# Patient Record
Sex: Female | Born: 1965 | Race: Black or African American | Hispanic: No | Marital: Married | Smoking: Never smoker
Health system: Southern US, Community
[De-identification: ages and names within clinical notes are randomized; demographics above are authoritative.]

## PROBLEM LIST (undated history)

## (undated) DIAGNOSIS — E119 Type 2 diabetes mellitus without complications: Secondary | ICD-10-CM

## (undated) DIAGNOSIS — I10 Essential (primary) hypertension: Secondary | ICD-10-CM

## (undated) HISTORY — DX: Type 2 diabetes mellitus without complications: E11.9

## (undated) HISTORY — PX: TUBAL LIGATION: SHX77

## (undated) HISTORY — DX: Essential (primary) hypertension: I10

---

## 2002-11-27 ENCOUNTER — Other Ambulatory Visit: Admission: RE | Admit: 2002-11-27 | Discharge: 2002-11-27 | Payer: Self-pay | Admitting: Obstetrics & Gynecology

## 2003-01-08 ENCOUNTER — Ambulatory Visit (HOSPITAL_COMMUNITY): Admission: RE | Admit: 2003-01-08 | Discharge: 2003-01-08 | Payer: Self-pay | Admitting: Obstetrics & Gynecology

## 2003-01-08 ENCOUNTER — Encounter: Payer: Self-pay | Admitting: Obstetrics & Gynecology

## 2008-06-19 ENCOUNTER — Emergency Department (HOSPITAL_COMMUNITY): Admission: EM | Admit: 2008-06-19 | Discharge: 2008-06-19 | Payer: Self-pay | Admitting: Family Medicine

## 2009-11-10 ENCOUNTER — Emergency Department (HOSPITAL_COMMUNITY): Admission: EM | Admit: 2009-11-10 | Discharge: 2009-11-11 | Payer: Self-pay | Admitting: Emergency Medicine

## 2015-04-12 ENCOUNTER — Encounter: Payer: Self-pay | Admitting: Obstetrics & Gynecology

## 2015-11-22 ENCOUNTER — Other Ambulatory Visit: Payer: Self-pay | Admitting: Family Medicine

## 2015-11-22 DIAGNOSIS — R1310 Dysphagia, unspecified: Secondary | ICD-10-CM

## 2015-11-23 ENCOUNTER — Other Ambulatory Visit: Payer: Self-pay

## 2017-08-28 ENCOUNTER — Ambulatory Visit: Payer: Self-pay | Admitting: Obstetrics & Gynecology

## 2017-08-28 ENCOUNTER — Ambulatory Visit (INDEPENDENT_AMBULATORY_CARE_PROVIDER_SITE_OTHER): Payer: 59 | Admitting: Obstetrics & Gynecology

## 2017-08-28 ENCOUNTER — Encounter: Payer: Self-pay | Admitting: Obstetrics & Gynecology

## 2017-08-28 VITALS — BP 120/80 | Ht 63.0 in | Wt 200.0 lb

## 2017-08-28 DIAGNOSIS — N921 Excessive and frequent menstruation with irregular cycle: Secondary | ICD-10-CM | POA: Diagnosis not present

## 2017-08-28 DIAGNOSIS — Z975 Presence of (intrauterine) contraceptive device: Secondary | ICD-10-CM | POA: Diagnosis not present

## 2017-08-28 DIAGNOSIS — Z23 Encounter for immunization: Secondary | ICD-10-CM

## 2017-08-28 NOTE — Progress Notes (Signed)
    Pamela Shelton 02/12/1966 [id number]        51 y.o.  [id number]  Married.  S/P Tubal Ligation.  RP:  BTB on Mirena IUD  HPI: On Mirena IUD for heavy periods, inserted 02/2013.  Was well on it until this month, when she had vaginal bleeding most days since 10/7th.  Had pelvic cramps towards the Right when bleeding.  Currently only having light spotting.  No abnormal d/c.  Mictions/BMs wnl.  Past medical history,surgical history, problem list, medications, allergies, family history and social history were all reviewed and documented in the EPIC chart.  Directed ROS with pertinent positives and negatives documented in the history of present illness/assessment and plan.  Exam:  Vitals:   08/28/17 1418  BP: 120/80  Weight: 200 lb (90.7 kg)  Height: 5\' 3"  (1.6 m)   General appearance:  Normal  Gyn exam:  Vulva normal.  Speculum:  Cervix normal with very long strings visible.  No IUD part seen otherwise.  Vagina normal.  Bimanual exam:  AV uterus, normal volume, mobile, NT.  No adnexal mass.   Assessment/Plan:  51 y.o. [id number]   1. Breakthrough bleeding associated with intrauterine device (IUD) F/U Pelvic US to verify Mirena IUD placement (very long strings with BTB) and r/o IU lesion. - Pelvic US  2. Flu vaccine need  - Flu Vaccine QUAD 36+ mos IM (Fluarix, Quad PF)  Counseling on above issues >50% x 15 minutes  Genia DelMarie-Lyne Hafiz Irion MD, 2:32 PM 08/28/2017

## 2017-08-28 NOTE — Patient Instructions (Signed)
1. Breakthrough bleeding associated with intrauterine device (IUD) F/U Pelvic US to verify Mirena IUD placement (very long strings with BTB) and r/o IU lesion. - Pelvic US  2. Flu vaccine need  - Flu Vaccine QUAD 36+ mos IM (Fluarix, Quad PF)  Brayton CavesJessie, it was a pleasure seeing you today!  See you again soon for your Pelvic US.

## 2018-11-24 ENCOUNTER — Encounter: Payer: Self-pay | Admitting: Obstetrics & Gynecology

## 2018-11-24 ENCOUNTER — Ambulatory Visit (INDEPENDENT_AMBULATORY_CARE_PROVIDER_SITE_OTHER): Payer: 59 | Admitting: Obstetrics & Gynecology

## 2018-11-24 VITALS — BP 126/84

## 2018-11-24 DIAGNOSIS — N898 Other specified noninflammatory disorders of vagina: Secondary | ICD-10-CM | POA: Diagnosis not present

## 2018-11-24 DIAGNOSIS — Z30432 Encounter for removal of intrauterine contraceptive device: Secondary | ICD-10-CM | POA: Diagnosis not present

## 2018-11-24 DIAGNOSIS — N911 Secondary amenorrhea: Secondary | ICD-10-CM

## 2018-11-24 LAB — WET PREP FOR TRICH, YEAST, CLUE

## 2018-11-24 MED ORDER — FLUCONAZOLE 150 MG PO TABS: 150.0000 mg | ORAL_TABLET | Freq: Every day | ORAL | 2 refills | Status: AC

## 2018-11-24 NOTE — Progress Notes (Signed)
    Pamela Shelton 01-15-1966 [id number]        53 y.o.  [id number]  Married  RP: Vulvar itching and irritation  HPI: Mirena IUD x 02/2013.  No BTB, no menses.    Vulvar irritation/itching x 1 month.  Vaginal discharge wnl per patient.  Used Vagisil without improvement.  No pelvic pain.  No pain with IC.   OB History  Gravida Para Term Preterm AB Living  6 4     1 4   SAB TAB Ectopic Multiple Live Births  1            # Outcome Date GA Lbr Len/2nd Weight Sex Delivery Anes PTL Lv  6 Gravida           5 SAB           4 Para           3 Para           2 Para           1 Para             Past medical history,surgical history, problem list, medications, allergies, family history and social history were all reviewed and documented in the EPIC chart.   Directed ROS with pertinent positives and negatives documented in the history of present illness/assessment and plan.  Exam:  Vitals:   11/24/18 1403  BP: 126/84   General appearance:  Normal  Abdomen: Normal  Gynecologic exam: Vulva with erythema at the labia minora and introitus.  Speculum: Cervix and vagina normal.  Mild increase in vaginal discharge.  Wet prep done.  Bimanual exam: Uterus anteverted, normal volume, mobile, nontender.  No adnexal mass, nontender bilaterally.  Wet prep: Yeasts present   Assessment/Plan:  53 y.o. [id number]   1. Vaginal pruritus Yeast vaginitis clinically and per wet prep.  Will treat with fluconazole 150 mg/tab 1 tablet daily for 3 days.  Usage of medication reviewed with patient and prescription sent to pharmacy. - WET PREP FOR TRICH, YEAST, CLUE  2. Amenorrhea, secondary Probably menopausal, will check [facility]SH today.  Mirena IUD was overdue by 10 months.  Removed today.  Recommend using condoms for 1 year after diagnosis of menopause. - [facility]  3. Encounter for IUD removal Mirena IUD was over due to remove by 10 months.  Easily removed today.  Use condoms for contraception.  May decide to  follow-up for a Mirena IUD insertion to have good contraception for 1 to 2 years into menopause.  Will follow-up for annual gynecologic exam.  Other orders - fluconazole (DIFLUCAN) 150 MG tablet; Take 1 tablet (150 mg total) by mouth daily for 3 days.  Counseling on above issues and coordination of care more than 50% for 25 minutes.  Genia Del MD, 2:10 PM 11/24/2018

## 2018-11-24 NOTE — Patient Instructions (Signed)
1. Vaginal pruritus Yeast vaginitis clinically and per wet prep.  Will treat with fluconazole 150 mg/tab 1 tablet daily for 3 days.  Usage of medication reviewed with patient and prescription sent to pharmacy. - WET PREP FOR TRICH, YEAST, CLUE  2. Amenorrhea, secondary Probably menopausal, will check FSH today.  Mirena IUD was overdue by 10 months.  Removed today.  Recommend using condoms for 1 year after diagnosis of menopause. - [facility]  3. Encounter for IUD removal Mirena IUD was over due to remove by 10 months.  Easily removed today.  Use condoms for contraception.  May decide to follow-up for a Mirena IUD insertion to have good contraception for 1 to 2 years into menopause.  Will follow-up for annual gynecologic exam.  Other orders - fluconazole (DIFLUCAN) 150 MG tablet; Take 1 tablet (150 mg total) by mouth daily for 3 days.  [place], it was a pleasure seeing you today!  I will inform you of your results as soon as they are available.

## 2018-11-25 ENCOUNTER — Ambulatory Visit: Payer: 59 | Admitting: Obstetrics & Gynecology

## 2018-11-25 LAB — FOLLICLE STIMULATING HORMONE: FSH: 53 m[IU]/mL

## 2018-12-31 ENCOUNTER — Ambulatory Visit
Admission: RE | Admit: 2018-12-31 | Discharge: 2018-12-31 | Disposition: A | Discharge status: Home / Self Care | Payer: 59 | Source: Ambulatory Visit | Attending: Family Medicine | Admitting: Family Medicine

## 2018-12-31 ENCOUNTER — Other Ambulatory Visit: Payer: Self-pay | Admitting: Family Medicine

## 2018-12-31 DIAGNOSIS — M7989 Other specified soft tissue disorders: Secondary | ICD-10-CM

## 2018-12-31 DIAGNOSIS — M545 Low back pain, unspecified: Secondary | ICD-10-CM

## 2019-01-29 ENCOUNTER — Other Ambulatory Visit: Payer: Self-pay

## 2019-02-02 ENCOUNTER — Ambulatory Visit (INDEPENDENT_AMBULATORY_CARE_PROVIDER_SITE_OTHER): Payer: 59 | Admitting: Obstetrics & Gynecology

## 2019-02-02 ENCOUNTER — Other Ambulatory Visit: Payer: Self-pay

## 2019-02-02 ENCOUNTER — Encounter: Payer: Self-pay | Admitting: Obstetrics & Gynecology

## 2019-02-02 VITALS — BP 122/80 | Ht 63.0 in | Wt 204.0 lb

## 2019-02-02 DIAGNOSIS — Z9851 Tubal ligation status: Secondary | ICD-10-CM | POA: Diagnosis not present

## 2019-02-02 DIAGNOSIS — E6609 Other obesity due to excess calories: Secondary | ICD-10-CM

## 2019-02-02 DIAGNOSIS — Z01419 Encounter for gynecological examination (general) (routine) without abnormal findings: Secondary | ICD-10-CM | POA: Diagnosis not present

## 2019-02-02 DIAGNOSIS — Z780 Asymptomatic menopausal state: Secondary | ICD-10-CM | POA: Diagnosis not present

## 2019-02-02 DIAGNOSIS — Z6836 Body mass index (BMI) 36.0-36.9, adult: Secondary | ICD-10-CM

## 2019-02-02 NOTE — Progress Notes (Signed)
Pamela Shelton [birth year] [id number]   History:    53 y.o. [id number] Married.  S/P TL.  RP:  Established patient presenting for annual gyn exam   HPI: Menopause, well on no HRT.  No PMB.  No pelvic pain.  No pain with IC.  S/P TL.  Breasts normal.  BMI 36.14.  Needs to exercise more.  Health labs with Fam MD.  Past medical history,surgical history, family history and social history were all reviewed and documented in the EPIC chart.  Gynecologic History Patient's last menstrual period was 08/11/2017. Contraception: Tubal Ligation Last Pap: 2016, normal Last mammogram: Scheduled 02/2019 Bone Density: Never Colonoscopy: 2019  Obstetric History OB History  Gravida Para Term Preterm AB Living  6 4     1 4   SAB TAB Ectopic Multiple Live Births  1            # Outcome Date GA Lbr Len/2nd Weight Sex Delivery Anes PTL Lv  6 Gravida           5 SAB           4 Para           3 Para           2 Para           1 Para              ROS: A ROS was performed and pertinent positives and negatives are included in the history.  GENERAL: No fevers or chills. HEENT: No change in vision, no earache, sore throat or sinus congestion. NECK: No pain or stiffness. CARDIOVASCULAR: No chest pain or pressure. No palpitations. PULMONARY: No shortness of breath, cough or wheeze. GASTROINTESTINAL: No abdominal pain, nausea, vomiting or diarrhea, melena or bright red blood per rectum. GENITOURINARY: No urinary frequency, urgency, hesitancy or dysuria. MUSCULOSKELETAL: No joint or muscle pain, no back pain, no recent trauma. DERMATOLOGIC: No rash, no itching, no lesions. ENDOCRINE: No polyuria, polydipsia, no heat or cold intolerance. No recent change in weight. HEMATOLOGICAL: No anemia or easy bruising or bleeding. NEUROLOGIC: No headache, seizures, numbness, tingling or weakness. PSYCHIATRIC: No depression, no loss of interest in normal activity or change in sleep pattern.     Exam:   BP 122/80   Ht 5'  3" (1.6 m)   Wt 204 lb (92.5 kg)   LMP 08/11/2017 Comment: MIRENA  BMI 36.14 kg/m   Body mass index is 36.14 kg/m.  General appearance : Well developed well nourished female. No acute distress HEENT: Eyes: no retinal hemorrhage or exudates,  Neck supple, trachea midline, no carotid bruits, no thyroidmegaly Lungs: Clear to auscultation, no rhonchi or wheezes, or rib retractions  Heart: Regular rate and rhythm, no murmurs or gallops Breast:Examined in sitting and supine position were symmetrical in appearance, no palpable masses or tenderness,  no skin retraction, no nipple inversion, no nipple discharge, no skin discoloration, no axillary or supraclavicular lymphadenopathy Abdomen: no palpable masses or tenderness, no rebound or guarding Extremities: no edema or skin discoloration or tenderness  Pelvic: Vulva: Normal             Vagina: No gross lesions or discharge  Cervix: No gross lesions or discharge.  Pap reflex done.  Uterus  AV, normal size, shape and consistency, non-tender and mobile  Adnexa  Without masses or tenderness  Anus: Normal   Assessment/Plan:  53 y.o. female for annual exam   1. Encounter for routine gynecological examination  with Papanicolaou smear of cervix Normal gynecologic exam.  Pap reflex done.  Breast exam normal.  Screening mammogram scheduled April 2020.  Colonoscopy 2019.  Health labs with family physician.  2. S/P tubal ligation  3. Menopause present Well on no hormone replacement therapy.  No postmenopausal bleeding.  Recommend vitamin D supplements, calcium intake of 1.5 g/day total and regular weightbearing physical activities.  4. Class 2 obesity due to excess calories without serious comorbidity with body mass index (BMI) of 36.0 to 36.9 in adult Recommend a lower calorie/carb diet such as [employer].  Intermittent fasting recommended.  Increase aerobic physical activities to 5 times a week and weight lifting every 2 days.  Genia Del MD, 12:41 PM 02/02/2019

## 2019-02-05 ENCOUNTER — Encounter: Payer: Self-pay | Admitting: Obstetrics & Gynecology

## 2019-02-05 LAB — PAP IG W/ RFLX HPV ASCU

## 2019-02-05 LAB — HUMAN PAPILLOMAVIRUS, HIGH RISK: HPV DNA High Risk: NOT DETECTED

## 2019-02-05 NOTE — Patient Instructions (Signed)
1. Encounter for routine gynecological examination with Papanicolaou smear of cervix Normal gynecologic exam.  Pap reflex done.  Breast exam normal.  Screening mammogram scheduled April 2020.  Colonoscopy 2019.  Health labs with family physician.  2. S/P tubal ligation  3. Menopause present Well on no hormone replacement therapy.  No postmenopausal bleeding.  Recommend vitamin D supplements, calcium intake of 1.5 g/day total and regular weightbearing physical activities.  4. Class 2 obesity due to excess calories without serious comorbidity with body mass index (BMI) of 36.0 to 36.9 in adult Recommend a lower calorie/carb diet such as [employer].  Intermittent fasting recommended.  Increase aerobic physical activities to 5 times a week and weight lifting every 2 days.  Pamela Shelton, it was a pleasure seeing you today!  I will inform you of your results as soon as they are available.

## 2019-04-06 ENCOUNTER — Telehealth: Payer: Self-pay

## 2019-04-06 NOTE — Telephone Encounter (Signed)
Patient was in for CE on 02/02/19. She is complaining of yeast infection symptoms that she is familiar with. She said previously you prescribed #3 Diflucan 150mg  tabs for her and she is asking if you can do that again.

## 2019-04-07 MED ORDER — FLUCONAZOLE 150 MG PO TABS: ORAL_TABLET | ORAL | 0 refills | Status: DC

## 2019-04-07 NOTE — Telephone Encounter (Signed)
Yes agree with Fluconazole same regimen, refill x 3.

## 2019-04-07 NOTE — Telephone Encounter (Signed)
Spoke with patient and informed her. Rx sent. 

## 2019-06-10 ENCOUNTER — Telehealth: Payer: Self-pay | Admitting: *Deleted

## 2019-06-10 NOTE — Telephone Encounter (Signed)
Agree with Fluconazole. 

## 2019-06-10 NOTE — Telephone Encounter (Signed)
Patient called c/o yeast infection asked if Rx for diflucan can be sent to pharmacy to help with this? Please advise

## 2019-06-11 MED ORDER — FLUCONAZOLE 150 MG PO TABS: ORAL_TABLET | ORAL | 0 refills | Status: DC

## 2019-06-11 NOTE — Telephone Encounter (Signed)
Rx sent, patient informed.

## 2020-02-02 ENCOUNTER — Other Ambulatory Visit: Payer: Self-pay

## 2020-02-03 ENCOUNTER — Encounter: Payer: 59 | Admitting: Obstetrics & Gynecology

## 2020-02-03 ENCOUNTER — Ambulatory Visit: Payer: 59 | Admitting: Obstetrics & Gynecology

## 2020-02-03 ENCOUNTER — Encounter: Payer: Self-pay | Admitting: Obstetrics & Gynecology

## 2020-02-03 VITALS — BP 130/86 | Ht 63.0 in | Wt 205.0 lb

## 2020-02-03 DIAGNOSIS — N951 Menopausal and female climacteric states: Secondary | ICD-10-CM

## 2020-02-03 DIAGNOSIS — Z6836 Body mass index (BMI) 36.0-36.9, adult: Secondary | ICD-10-CM

## 2020-02-03 DIAGNOSIS — R87610 Atypical squamous cells of undetermined significance on cytologic smear of cervix (ASC-US): Secondary | ICD-10-CM

## 2020-02-03 DIAGNOSIS — E6609 Other obesity due to excess calories: Secondary | ICD-10-CM | POA: Diagnosis not present

## 2020-02-03 DIAGNOSIS — Z01419 Encounter for gynecological examination (general) (routine) without abnormal findings: Secondary | ICD-10-CM | POA: Diagnosis not present

## 2020-02-03 MED ORDER — ESTRADIOL 0.1 MG/24HR TD PTWK: 0.1000 mg | MEDICATED_PATCH | TRANSDERMAL | 4 refills | Status: DC

## 2020-02-03 MED ORDER — PROGESTERONE MICRONIZED 100 MG PO CAPS: 100.0000 mg | ORAL_CAPSULE | Freq: Every day | ORAL | 4 refills | Status: DC

## 2020-02-03 NOTE — Patient Instructions (Signed)
1. Encounter for routine gynecological examination with Papanicolaou smear of cervix Normal gynecologic exam in menopause.  Pap reflex done.  Breast exam normal.  Will obtain screening mammogram report from 2020.  Patient will schedule a screening mammogram now.  Colonoscopy 2019.  Health labs with family physician.  2. ASCUS of cervix with negative high risk HPV Pap reflex done today.  3. Menopausal syndrome Very symptomatic menopause with vasomotor symptoms in the form of hot flushes and night sweats preventing good sleep.  No contraindication to hormone replacement therapy.  Usage, risks and benefits of hormone replacement therapy thoroughly reviewed.  Will start on estradiol patch 0.1 weekly and Prometrium 100 mg/caps., 1 capsule per mouth at bedtime daily.  Prescriptions sent to pharmacy.  4. Class 2 obesity due to excess calories without serious comorbidity with body mass index (BMI) of 36.0 to 36.9 in adult Recommend a lower calorie/carb diet such as [employer], although patient seems to already be on the brain a low calorie nutrition plan.  Recommend increasing aerobic activities at 5 times a week and light weightlifting every 2 days.  Other orders - estradiol (CLIMARA - DOSED IN MG/24 HR) 0.1 mg/24hr patch; Place 1 patch (0.1 mg total) onto the skin once a week. - progesterone (PROMETRIUM) 100 MG capsule; Take 1 capsule (100 mg total) by mouth at bedtime.  Pamela Shelton, it was a pleasure seeing you today!  I will inform you of your results as soon as they are available.

## 2020-02-03 NOTE — Progress Notes (Signed)
Pamela Shelton 12/26/1965 [id number]   History:    54 y.o. [id number] Married.  S/P TL.  RP:  Established patient presenting for annual gyn exam   HPI: Menopause with severe hot flushes and night sweats, wants to start on HRT.  No PMB.  No pelvic pain.  No pain with IC.  S/P TL.  Breasts normal.  BMI 36.31.  Needs to exercise more.  Health labs with Fam MD. No personal history of breast cancer and no family history.  No history of stroke or other types of blood clots.   Past medical history,surgical history, family history and social history were all reviewed and documented in the EPIC chart.  Gynecologic History Patient's last menstrual period was 08/11/2017.  Obstetric History OB History  Gravida Para Term Preterm AB Living  6 4     1 4   SAB TAB Ectopic Multiple Live Births  1            # Outcome Date GA Lbr Len/2nd Weight Sex Delivery Anes PTL Lv  6 Gravida           5 SAB           4 Para           3 Para           2 Para           1 Para              ROS: A ROS was performed and pertinent positives and negatives are included in the history.  GENERAL: No fevers or chills. HEENT: No change in vision, no earache, sore throat or sinus congestion. NECK: No pain or stiffness. CARDIOVASCULAR: No chest pain or pressure. No palpitations. PULMONARY: No shortness of breath, cough or wheeze. GASTROINTESTINAL: No abdominal pain, nausea, vomiting or diarrhea, melena or bright red blood per rectum. GENITOURINARY: No urinary frequency, urgency, hesitancy or dysuria. MUSCULOSKELETAL: No joint or muscle pain, no back pain, no recent trauma. DERMATOLOGIC: No rash, no itching, no lesions. ENDOCRINE: No polyuria, polydipsia, no heat or cold intolerance. No recent change in weight. HEMATOLOGICAL: No anemia or easy bruising or bleeding. NEUROLOGIC: No headache, seizures, numbness, tingling or weakness. PSYCHIATRIC: No depression, no loss of interest in normal activity or change in sleep pattern.       Exam:   BP 130/86   Ht 5\' 3"  (1.6 m)   Wt 205 lb (93 kg)   LMP 08/11/2017   BMI 36.31 kg/m   Body mass index is 36.31 kg/m.  General appearance : Well developed well nourished female. No acute distress HEENT: Eyes: no retinal hemorrhage or exudates,  Neck supple, trachea midline, no carotid bruits, no thyroidmegaly Lungs: Clear to auscultation, no rhonchi or wheezes, or rib retractions  Heart: Regular rate and rhythm, no murmurs or gallops Breast:Examined in sitting and supine position were symmetrical in appearance, no palpable masses or tenderness,  no skin retraction, no nipple inversion, no nipple discharge, no skin discoloration, no axillary or supraclavicular lymphadenopathy Abdomen: no palpable masses or tenderness, no rebound or guarding Extremities: no edema or skin discoloration or tenderness  Pelvic: Vulva: Normal             Vagina: No gross lesions or discharge  Cervix: No gross lesions or discharge.  Pap reflex done.  Uterus  AV, normal size, shape and consistency, non-tender and mobile  Adnexa  Without masses or tenderness  Anus: Normal   Assessment/Plan:  54  y.o. female for annual exam   1. Encounter for routine gynecological examination with Papanicolaou smear of cervix Normal gynecologic exam in menopause.  Pap reflex done.  Breast exam normal.  Will obtain screening mammogram report from 2020.  Patient will schedule a screening mammogram now.  Colonoscopy 2019.  Health labs with family physician.  2. ASCUS of cervix with negative high risk HPV Pap reflex done today.  3. Menopausal syndrome Very symptomatic menopause with vasomotor symptoms in the form of hot flushes and night sweats preventing good sleep.  No contraindication to hormone replacement therapy.  Usage, risks and benefits of hormone replacement therapy thoroughly reviewed.  Will start on estradiol patch 0.1 weekly and Prometrium 100 mg/caps., 1 capsule per mouth at bedtime daily.   Prescriptions sent to pharmacy.  4. Class 2 obesity due to excess calories without serious comorbidity with body mass index (BMI) of 36.0 to 36.9 in adult Recommend a lower calorie/carb diet such as [employer], although patient seems to already be on the brain a low calorie nutrition plan.  Recommend increasing aerobic activities at 5 times a week and light weightlifting every 2 days.  Other orders - estradiol (CLIMARA - DOSED IN MG/24 HR) 0.1 mg/24hr patch; Place 1 patch (0.1 mg total) onto the skin once a week. - progesterone (PROMETRIUM) 100 MG capsule; Take 1 capsule (100 mg total) by mouth at bedtime.  Genia Del MD, 12:44 PM 02/03/2020

## 2020-02-05 LAB — PAP IG W/ RFLX HPV ASCU

## 2020-02-23 ENCOUNTER — Telehealth: Payer: Self-pay | Admitting: *Deleted

## 2020-02-23 NOTE — Telephone Encounter (Signed)
Patient called c/o having issues with the estradiol weekly patch staying on her skin,  Reports patch falls off about 2 days after placing patch on skin. She asked if another medication could be prescribed? Please advise

## 2020-02-24 MED ORDER — ESTRADIOL 0.1 MG/24HR TD PTTW: 1.0000 | MEDICATED_PATCH | TRANSDERMAL | 3 refills | Status: DC

## 2020-02-24 NOTE — Telephone Encounter (Signed)
Recommend same dosage patch twice a week.  Smaller patch will probably stick better and only for 3-4 days each.

## 2020-02-24 NOTE — Telephone Encounter (Signed)
Patient informed, minivelle 0.1 mg patch sent to pharmacy.

## 2020-03-26 IMAGING — CR DG LUMBAR SPINE COMPLETE 4+V
5 series · 5 of 5 positions shown · non-contrast
Comparison: None.

CLINICAL DATA: Low back pain for 2 weeks, no known injury, initial
encounter

EXAM:
LUMBAR SPINE - COMPLETE 4+ VIEW

[w lumbar spine ap]
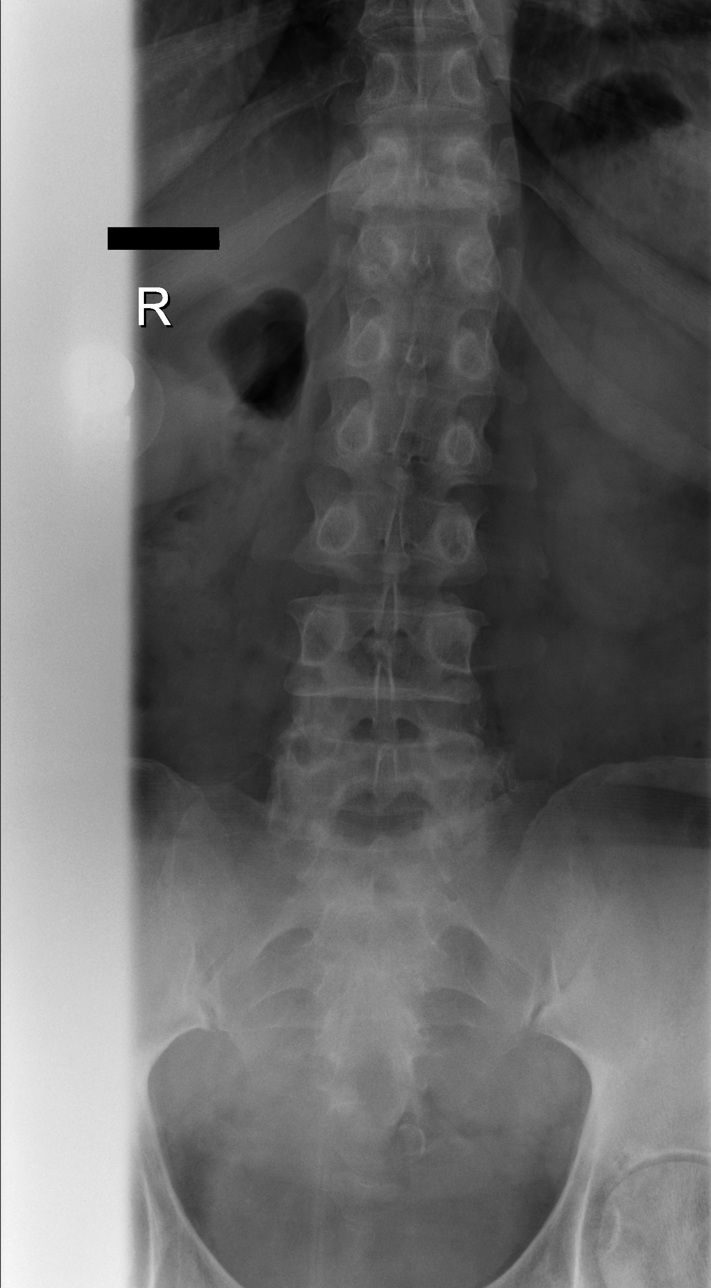

[w lumbar spine obl (1 of 2)]
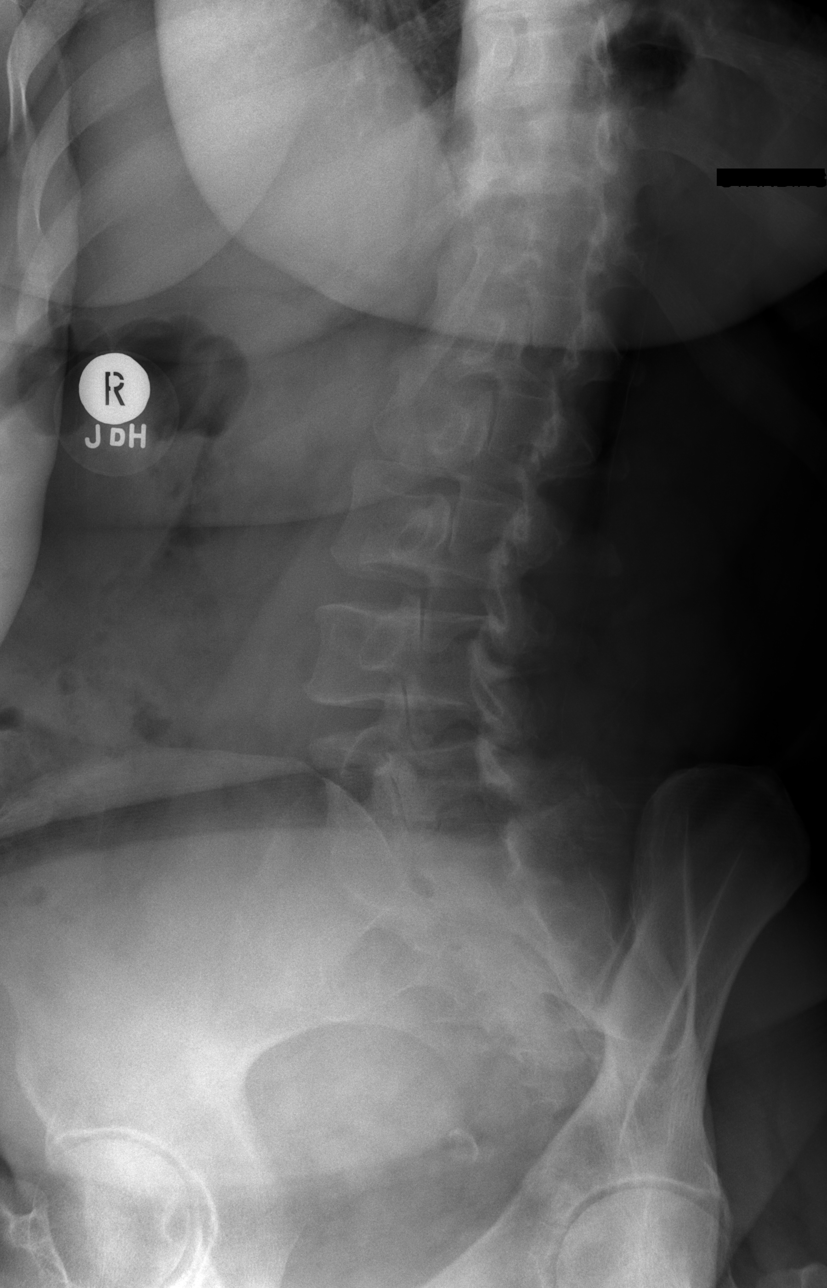

[w lumbar spine obl (2 of 2)]
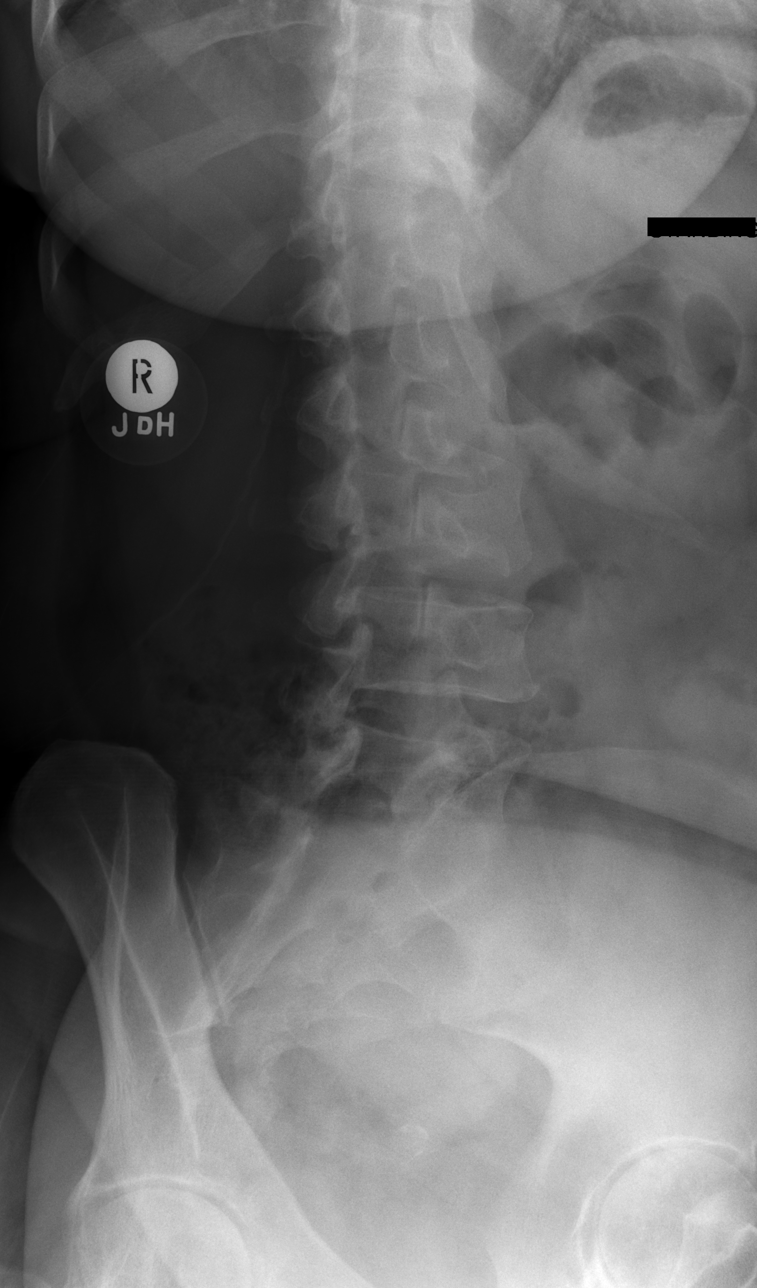

[w lumbar spine lat]
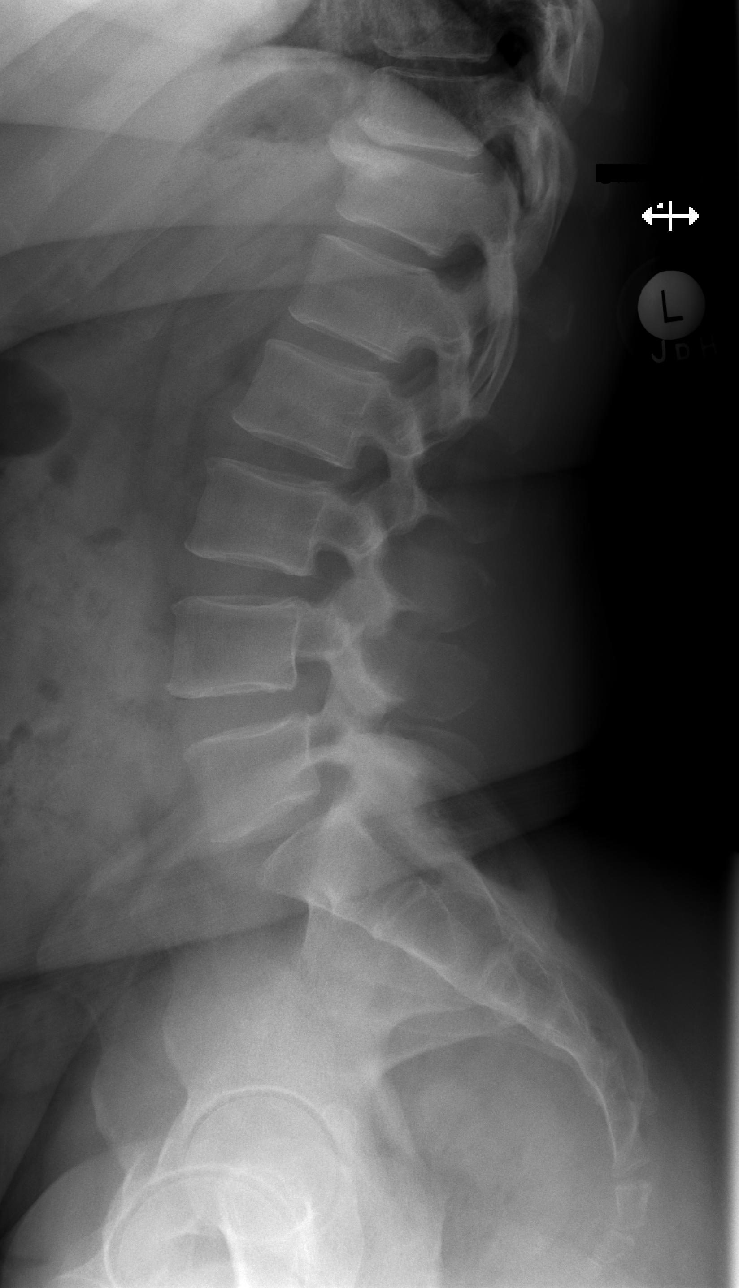

[w lumbar l-5 s-1 spot]
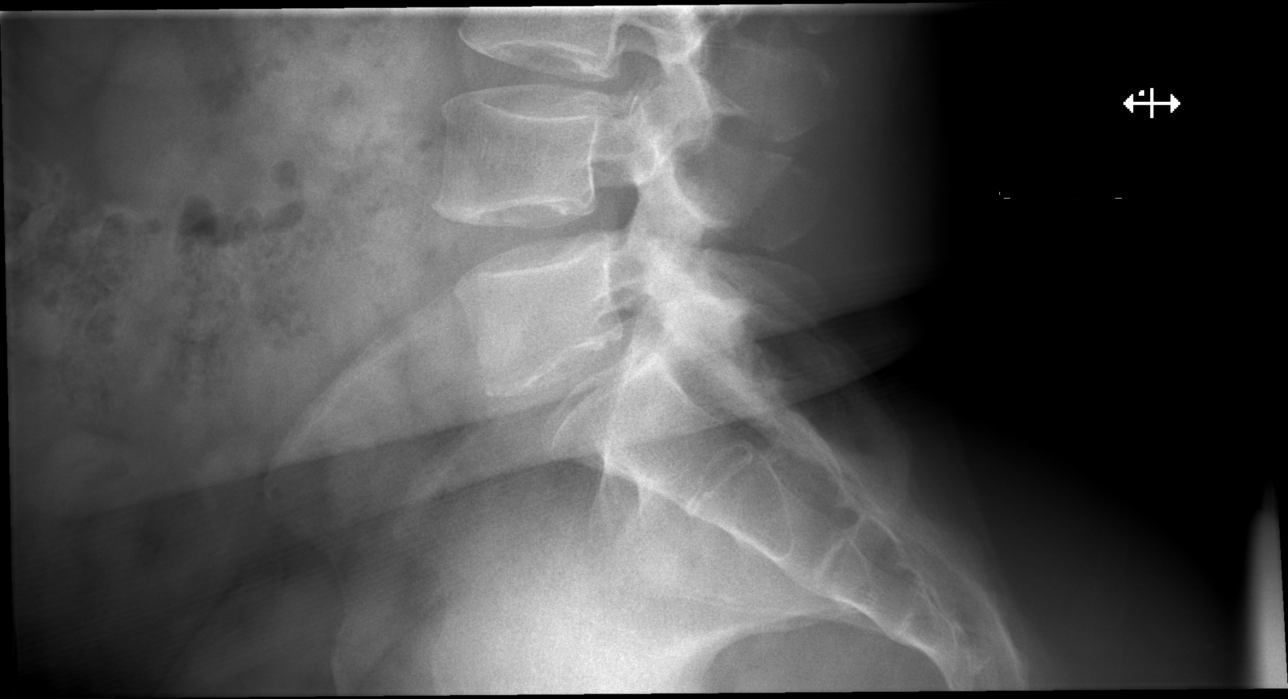

[5 of 5 positions shown; findings below may reference images not displayed]

FINDINGS: Five lumbar type vertebral bodies are well visualized. Vertebral
body height is well maintained. No pars defects are seen.
Hypertrophic facet changes are noted particularly at L5-S1. Mild
degenerative anterolisthesis of L5 on S1 is seen. No other focal
abnormality is noted.
IMPRESSION: Degenerative change with mild anterolisthesis of L5-S1.

## 2020-04-11 ENCOUNTER — Telehealth: Payer: Self-pay | Admitting: *Deleted

## 2020-04-11 DIAGNOSIS — N926 Irregular menstruation, unspecified: Secondary | ICD-10-CM

## 2020-04-11 NOTE — Telephone Encounter (Signed)
Pt is concerned with irregular bleeding . Last bleeding onset 04/06/2020, pt has not had a cycle since IUD was removed.  No pain but cramping, right side abdominal discomfort. Pt states she has seen clots as well , vaginal discharge minimum per pt .Will route to Dr. Seymour Bars for advice.

## 2020-04-11 NOTE — Telephone Encounter (Signed)
Schedule Pelvic US with me.

## 2020-05-05 ENCOUNTER — Encounter: Payer: Self-pay | Admitting: Obstetrics & Gynecology

## 2020-05-05 ENCOUNTER — Ambulatory Visit (INDEPENDENT_AMBULATORY_CARE_PROVIDER_SITE_OTHER): Payer: 59 | Admitting: Obstetrics & Gynecology

## 2020-05-05 ENCOUNTER — Other Ambulatory Visit: Payer: Self-pay

## 2020-05-05 ENCOUNTER — Ambulatory Visit (INDEPENDENT_AMBULATORY_CARE_PROVIDER_SITE_OTHER): Payer: 59

## 2020-05-05 VITALS — BP 128/80

## 2020-05-05 DIAGNOSIS — N926 Irregular menstruation, unspecified: Secondary | ICD-10-CM

## 2020-05-05 DIAGNOSIS — Z79890 Hormone replacement therapy (postmenopausal): Secondary | ICD-10-CM

## 2020-05-05 DIAGNOSIS — N950 Postmenopausal bleeding: Secondary | ICD-10-CM

## 2020-05-05 NOTE — Progress Notes (Signed)
    Pamela Shelton Jul 01, 1966 [id number]        54 y.o.  [id number]   RP: PMB on HRT for Pelvic US  HPI: Started on HRT 01/2020 on Estradiol patch 0.1 and Prometrium 100 mg HS.  Had PMB 6/2nd to 6/9th.  No pelvic pain.  No abnormal vaginal d/c.  No fever.  Urine/BMs normal.   OB History  Gravida Para Term Preterm AB Living  6 4     1 4   SAB TAB Ectopic Multiple Live Births  1            # Outcome Date GA Lbr Len/2nd Weight Sex Delivery Anes PTL Lv  6 Gravida           5 SAB           4 Para           3 Para           2 Para           1 Para             Past medical history,surgical history, problem list, medications, allergies, family history and social history were all reviewed and documented in the EPIC chart.   Directed ROS with pertinent positives and negatives documented in the history of present illness/assessment and plan.  Exam:  Vitals:   05/05/20 1120  BP: 128/80   General appearance:  Normal  Pelvic 07/06/20 today: T/V images.  Anteverted uterus enlarged, bulky with multiple fibroids.  The overall uterine size is measured at 10.98 x 5.33 x 5.18 cm.  The fibroids are subserosal and intramural with calcifications.  The largest fibroid is measured at 2.5 cm located at the uterine fundus.  The endometrial lining is symmetrical with scant free fluid which is avascular.  The endometrial lining is measured at 4.87 mm.  No mass seen.  The thickness is within normal limit for a postmenopausal patient on hormone replacement therapy.  Both ovaries appear atrophic and are best seen transabdominally.  No adnexal mass.  No free fluid in the posterior cul-de-sac.   Assessment/Plan:  54 y.o. 41   1. Postmenopausal hormone replacement therapy Patient's menopausal vasomotor symptoms are well controlled with estradiol patch 0.1 and Prometrium 100 mg at bedtime.  Pelvic ultrasound findings reviewed thoroughly.  The endometrial lining is measured at 4.87 mm with scant free fluid.  Decision  to continue on same hormone replacement therapy dosage.  2. Postmenopausal bleeding Pelvic [id number] findings thoroughly reviewed with patient.  Patient reassured that no lesion is present at the level of the endometrium.   [place] MD, 11:25 AM 05/05/2020

## 2020-05-07 ENCOUNTER — Encounter: Payer: Self-pay | Admitting: Obstetrics & Gynecology

## 2020-05-07 NOTE — Patient Instructions (Signed)
1. Postmenopausal hormone replacement therapy Patient's menopausal vasomotor symptoms are well controlled with estradiol patch 0.1 and Prometrium 100 mg at bedtime.  Pelvic ultrasound findings reviewed thoroughly.  The endometrial lining is measured at 4.87 mm with scant free fluid.  Decision to continue on same hormone replacement therapy dosage.  2. Postmenopausal bleeding Pelvic US findings thoroughly reviewed with patient.  Patient reassured that no lesion is present at the level of the endometrium.  Pamela Shelton, it was a pleasure seeing you today!

## 2021-02-13 ENCOUNTER — Other Ambulatory Visit: Payer: Self-pay | Admitting: Obstetrics & Gynecology

## 2021-12-29 ENCOUNTER — Encounter (HOSPITAL_BASED_OUTPATIENT_CLINIC_OR_DEPARTMENT_OTHER): Payer: Self-pay | Admitting: Emergency Medicine

## 2021-12-29 ENCOUNTER — Other Ambulatory Visit: Payer: Self-pay

## 2021-12-29 ENCOUNTER — Emergency Department (HOSPITAL_BASED_OUTPATIENT_CLINIC_OR_DEPARTMENT_OTHER)
Admission: EM | Admit: 2021-12-29 | Discharge: 2021-12-29 | Disposition: A | Discharge status: Home / Self Care | Payer: 59 | Attending: Emergency Medicine | Admitting: Emergency Medicine

## 2021-12-29 DIAGNOSIS — I10 Essential (primary) hypertension: Secondary | ICD-10-CM | POA: Diagnosis present

## 2021-12-29 DIAGNOSIS — Z7984 Long term (current) use of oral hypoglycemic drugs: Secondary | ICD-10-CM | POA: Insufficient documentation

## 2021-12-29 DIAGNOSIS — E119 Type 2 diabetes mellitus without complications: Secondary | ICD-10-CM | POA: Diagnosis not present

## 2021-12-29 DIAGNOSIS — R42 Dizziness and giddiness: Secondary | ICD-10-CM

## 2021-12-29 LAB — CBC WITH DIFFERENTIAL/PLATELET
Abs Immature Granulocytes: 0 10*3/uL (ref 0.00–0.07)
Basophils Absolute: 0 10*3/uL (ref 0.0–0.1)
Basophils Relative: 1 %
Eosinophils Absolute: 0.2 10*3/uL (ref 0.0–0.5)
Eosinophils Relative: 4 %
HCT: 37.9 % (ref 36.0–46.0)
Hemoglobin: 11.9 g/dL — ABNORMAL LOW (ref 12.0–15.0)
Immature Granulocytes: 0 %
Lymphocytes Relative: 33 %
Lymphs Abs: 1.4 10*3/uL (ref 0.7–4.0)
MCH: 26.2 pg (ref 26.0–34.0)
MCHC: 31.4 g/dL (ref 30.0–36.0)
MCV: 83.5 fL (ref 80.0–100.0)
Monocytes Absolute: 0.4 10*3/uL (ref 0.1–1.0)
Monocytes Relative: 10 %
Neutro Abs: 2.3 10*3/uL (ref 1.7–7.7)
Neutrophils Relative %: 52 %
Platelets: 319 10*3/uL (ref 150–400)
RBC: 4.54 MIL/uL (ref 3.87–5.11)
RDW: 15.9 % — ABNORMAL HIGH (ref 11.5–15.5)
WBC: 4.3 10*3/uL (ref 4.0–10.5)
nRBC: 0 % (ref 0.0–0.2)

## 2021-12-29 LAB — BASIC METABOLIC PANEL
Anion gap: 9 (ref 5–15)
BUN: 20 mg/dL (ref 6–20)
CO2: 25 mmol/L (ref 22–32)
Calcium: 9.6 mg/dL (ref 8.9–10.3)
Chloride: 106 mmol/L (ref 98–111)
Creatinine, Ser: 0.69 mg/dL (ref 0.44–1.00)
GFR, Estimated: >60 mL/min (ref >60)
Glucose, Bld: 132 mg/dL — ABNORMAL HIGH (ref 70–99)
Potassium: 3.8 mmol/L (ref 3.5–5.1)
Sodium: 140 mmol/L (ref 135–145)

## 2021-12-29 MED ORDER — TELMISARTAN-HCTZ 40-12.5 MG PO TABS: 1.0000 | ORAL_TABLET | Freq: Every day | ORAL | 1 refills | Status: AC

## 2021-12-29 MED ORDER — BYDUREON 2 MG ~~LOC~~ PEN: 2.0000 mg | PEN_INJECTOR | SUBCUTANEOUS | 1 refills | Status: DC

## 2021-12-29 NOTE — ED Provider Notes (Signed)
[facility] EMERGENCY DEPT Provider Note   CSN: [id number] Arrival date & time: 12/29/21  6203     History  Chief Complaint  Patient presents with   Dizziness   Hypertension    Pamela Shelton is a 56 y.o. female.  Patient is a 56 year old female with history of diabetes and hypertension.  She presents today with complaints of dizziness and elevated blood pressure.  She tells me she has been off of her blood pressure medications for several months.  She tells me she owes her primary doctor money and her primary will not see her until the bill is paid.  Patient reports feeling poorly intermittently over the past 2 weeks.  She denies any fevers or chills.  She denies any cough.  She does report intermittent headaches when her blood pressure is elevated.  The history is provided by the patient.  Dizziness Quality:  Lightheadedness and imbalance Severity:  Moderate Onset quality:  Gradual Duration:  2 weeks Timing:  Intermittent Progression:  Worsening Chronicity:  New Relieved by:  Nothing Worsened by:  Nothing Ineffective treatments:  None tried Hypertension      Home Medications Prior to Admission medications   Medication Sig Start Date End Date Taking? Authorizing Provider  dapagliflozin propanediol (FARXIGA) 10 MG TABS tablet Take 10 mg by mouth daily.    [provider]  estradiol (MINIVELLE) 0.1 MG/24HR patch Place 1 patch (0.1 mg total) onto the skin 2 (two) times a week. 02/25/20   Genia Del, MD  Exenatide ER (BYDUREON) 2 MG PEN Inject into the skin.    [provider]  finasteride (PROPECIA) 1 MG tablet Take 1 mg by mouth daily.    [provider]  metFORMIN (GLUCOPHAGE) 1000 MG tablet Take 1,000 mg by mouth 2 (two) times daily with a meal.    [provider]  progesterone (PROMETRIUM) 100 MG capsule Take 1 capsule (100 mg total) by mouth at bedtime. 02/03/20   Genia Del, MD   telmisartan-hydrochlorothiazide (MICARDIS HCT) 40-12.5 MG tablet Take 1 tablet by mouth daily.    [provider]      Allergies    Patient has no known allergies.    Review of Systems   Review of Systems  Neurological:  Positive for dizziness.  All other systems reviewed and are negative.  Physical Exam Updated Vital Signs BP (!) 165/102 (BP Location: Right Arm)    Pulse (!) 58    Temp 98.1 F (36.7 C) (Oral)    Resp 18    Ht 5\' 2"  (1.575 m)    Wt 93 kg    LMP 08/11/2017    SpO2 98%    BMI 37.49 kg/m  Physical Exam Vitals and nursing note reviewed.  Constitutional:      General: She is not in acute distress.    Appearance: She is well-developed. She is not diaphoretic.  HENT:     Head: Normocephalic and atraumatic.  Cardiovascular:     Rate and Rhythm: Normal rate and regular rhythm.     Heart sounds: No murmur heard.   No friction rub. No gallop.  Pulmonary:     Effort: Pulmonary effort is normal. No respiratory distress.     Breath sounds: Normal breath sounds. No wheezing.  Abdominal:     General: Bowel sounds are normal. There is no distension.     Palpations: Abdomen is soft.     Tenderness: There is no abdominal tenderness.  Musculoskeletal:  General: Normal range of motion.     Cervical back: Normal range of motion and neck supple.  Skin:    General: Skin is warm and dry.  Neurological:     General: No focal deficit present.     Mental Status: She is alert and oriented to person, place, and time. Mental status is at baseline.     Cranial Nerves: No cranial nerve deficit.     Sensory: No sensory deficit.     Motor: No weakness.     Coordination: Coordination normal.    ED Results / Procedures / Treatments   Labs (all labs ordered are listed, but only abnormal results are displayed) Labs Reviewed  BASIC METABOLIC PANEL  CBC WITH DIFFERENTIAL/PLATELET    EKG None  Radiology No results found.  Procedures Procedures    Medications  Ordered in ED Medications - No data to display  ED Course/ Medical Decision Making/ A&P  Patient is a 56 year old female presenting with headache, dizziness, and elevated blood pressure.  She has been off of her blood pressure medications for many weeks.  Patient's laboratory studies are unremarkable and neurologic exam is nonfocal.  Patient's medications will be represcribed for her.  It sounds as though she has no access to her primary care doctor due to unpaid bills at the office.  Patient advised to keep a record of her blood pressures and follow-up with a another primary care doctor in the near future.  Final Clinical Impression(s) / ED Diagnoses Final diagnoses:  None    Rx / DC Orders ED Discharge Orders     None         Geoffery Lyons, MD 12/29/21 2301

## 2021-12-29 NOTE — Discharge Instructions (Signed)
Resume taking your telmisartan and exenatide as before.  These prescriptions have been refilled for you.  Keep a record of your blood sugars and follow-up with a primary doctor in the near future.

## 2021-12-29 NOTE — ED Triage Notes (Signed)
°  Patient comes in with headache and dizziness that has been going on for two weeks.  Patient states she has hypertension and has been unable to get her BP medication refilled because she has a bill at her PCP office and they refused to fill it until the bill was paid.  Patient woke up with a headache this morning and took 650 mg tylenol.  No pain at this time but still dizzy.

## 2022-07-03 ENCOUNTER — Ambulatory Visit: Payer: 59 | Admitting: Radiology

## 2022-07-03 VITALS — BP 98/60

## 2022-07-03 DIAGNOSIS — N898 Other specified noninflammatory disorders of vagina: Secondary | ICD-10-CM

## 2022-07-03 DIAGNOSIS — N760 Acute vaginitis: Secondary | ICD-10-CM | POA: Diagnosis not present

## 2022-07-03 LAB — WET PREP FOR TRICH, YEAST, CLUE

## 2022-07-03 MED ORDER — NYSTATIN-TRIAMCINOLONE 100000-0.1 UNIT/GM-% EX OINT: 1.0000 | TOPICAL_OINTMENT | Freq: Two times a day (BID) | CUTANEOUS | 0 refills | Status: DC

## 2022-07-03 MED ORDER — METRONIDAZOLE 500 MG PO TABS: 500.0000 mg | ORAL_TABLET | Freq: Two times a day (BID) | ORAL | 0 refills | Status: DC

## 2022-07-03 NOTE — Progress Notes (Signed)
      Subjective: Pamela Shelton is a 56 y.o. female who complains of vaginal itching and burning. No d/c, no odor.    Review of Systems  Past Medical History:  Diagnosis Date   Diabetes mellitus without complication (HCC)    Hypertension       Objective:  Today's Vitals   07/03/22 1453  BP: 98/60   There is no height or weight on file to calculate BMI.   -General: no acute distress -Vulva: without lesions or discharge -Vagina: discharge present, wet prep obtained -Cervix: no lesion or discharge, no CMT -Perineum: no lesions -Uterus: Mobile, non tender -Adnexa: no masses or tenderness   Microscopic wet-mount exam shows clue cells.   Chaperone offered and declined.  Assessment:/Plan:   1. Vaginal itching  - WET PREP FOR TRICH, YEAST, CLUE  2. Vulvovaginitis  - nystatin-triamcinolone ointment (MYCOLOG); Apply 1 Application topically 2 (two) times daily.  Dispense: 30 g; Refill: 0 - metroNIDAZOLE (FLAGYL) 500 MG tablet; Take 1 tablet (500 mg total) by mouth 2 (two) times daily.  Dispense: 14 tablet; Refill: 0    Will contact patient with results of testing completed today. Avoid intercourse until symptoms are resolved. Safe sex encouraged. Avoid the use of soaps or perfumed products in the peri area. Avoid tub baths and sitting in sweaty or wet clothing for prolonged periods of time.

## 2022-07-26 ENCOUNTER — Ambulatory Visit: Payer: 59 | Admitting: Obstetrics & Gynecology

## 2022-09-18 ENCOUNTER — Encounter: Payer: Self-pay | Admitting: Obstetrics & Gynecology

## 2022-09-18 ENCOUNTER — Other Ambulatory Visit (HOSPITAL_COMMUNITY)
Admission: RE | Admit: 2022-09-18 | Discharge: 2022-09-18 | Disposition: A | Discharge status: Home / Self Care | Payer: 59 | Source: Ambulatory Visit | Attending: Obstetrics & Gynecology | Admitting: Obstetrics & Gynecology

## 2022-09-18 ENCOUNTER — Ambulatory Visit (INDEPENDENT_AMBULATORY_CARE_PROVIDER_SITE_OTHER): Payer: 59 | Admitting: Obstetrics & Gynecology

## 2022-09-18 VITALS — BP 110/64 | HR 72 | Ht 62.75 in | Wt 183.0 lb

## 2022-09-18 DIAGNOSIS — Z23 Encounter for immunization: Secondary | ICD-10-CM

## 2022-09-18 DIAGNOSIS — E6609 Other obesity due to excess calories: Secondary | ICD-10-CM

## 2022-09-18 DIAGNOSIS — Z01419 Encounter for gynecological examination (general) (routine) without abnormal findings: Secondary | ICD-10-CM | POA: Diagnosis present

## 2022-09-18 DIAGNOSIS — Z780 Asymptomatic menopausal state: Secondary | ICD-10-CM | POA: Diagnosis not present

## 2022-09-18 NOTE — Progress Notes (Signed)
Pamela Shelton 1966-08-09 [id number]   History:    56 y.o. [id number] Married.  S/P TL.   RP:  Established patient presenting for annual gyn exam    HPI: Postmenopause, well on no HRT x >1 year.  No PMB.  No pelvic pain.  No pain with IC.  S/P TL.  Last Pap Neg 01/2020.  Pap reflex today. No h/o abnormal Pap.  Breasts normal.  Mammo 2023, will obtain report.  BMI improved to 32.68.  Currently on Mounjaro.  Colono 2019.  Health labs with Fam MD.   Flu vaccine here today.   Past medical history,surgical history, family history and social history were all reviewed and documented in the EPIC chart.  Gynecologic History Patient's last menstrual period was 08/11/2017.   Obstetric History OB History  Gravida Para Term Preterm AB Living  6 4 4   2 4   SAB IAB Ectopic Multiple Live Births  1 1          # Outcome Date GA Lbr Len/2nd Weight Sex Delivery Anes PTL Lv  6 IAB           5 SAB           4 Term           3 Term           2 Term           1 Term              ROS: A ROS was performed and pertinent positives and negatives are included in the history. GENERAL: No fevers or chills. HEENT: No change in vision, no earache, sore throat or sinus congestion. NECK: No pain or stiffness. CARDIOVASCULAR: No chest pain or pressure. No palpitations. PULMONARY: No shortness of breath, cough or wheeze. GASTROINTESTINAL: No abdominal pain, nausea, vomiting or diarrhea, melena or bright red blood per rectum. GENITOURINARY: No urinary frequency, urgency, hesitancy or dysuria. MUSCULOSKELETAL: No joint or muscle pain, no back pain, no recent trauma. DERMATOLOGIC: No rash, no itching, no lesions. ENDOCRINE: No polyuria, polydipsia, no heat or cold intolerance. No recent change in weight. HEMATOLOGICAL: No anemia or easy bruising or bleeding. NEUROLOGIC: No headache, seizures, numbness, tingling or weakness. PSYCHIATRIC: No depression, no loss of interest in normal activity or change in sleep pattern.      Exam:   BP 110/64   Pulse 72   Ht 5' 2.75" (1.594 m)   Wt 183 lb (83 kg)   LMP 08/11/2017 Comment: BTL  SpO2 99%   BMI 32.68 kg/m   Body mass index is 32.68 kg/m.  General appearance : Well developed well nourished female. No acute distress HEENT: Eyes: no retinal hemorrhage or exudates,  Neck supple, trachea midline, no carotid bruits, no thyroidmegaly Lungs: Clear to auscultation, no rhonchi or wheezes, or rib retractions  Heart: Regular rate and rhythm, no murmurs or gallops Breast:Examined in sitting and supine position were symmetrical in appearance, no palpable masses or tenderness,  no skin retraction, no nipple inversion, no nipple discharge, no skin discoloration, no axillary or supraclavicular lymphadenopathy Abdomen: no palpable masses or tenderness, no rebound or guarding Extremities: no edema or skin discoloration or tenderness  Pelvic: Vulva: Normal             Vagina: No gross lesions or discharge  Cervix: No gross lesions or discharge.  Pap reflex done.  Uterus  AV, normal size, shape and consistency, non-tender and mobile  Adnexa  Without  masses or tenderness  Anus: Normal   Assessment/Plan:  56 y.o. female for annual exam   1. Encounter for routine gynecological examination with Papanicolaou smear of cervix Postmenopause, well on no HRT x >1 year.  No PMB.  No pelvic pain. No pain with IC.  S/P TL.  Last Pap Neg 01/2020.  Pap reflex today. No h/o abnormal Pap.  Breasts normal.  Mammo 2023, will obtain report. BMI improved to 32.68.  Currently on Mounjaro.  Colono 2019.  Health labs with Fam MD.   Flu vaccine here today. - Cytology - PAP( [place])  2. Postmenopause Postmenopause, well on no HRT x >1 year.  No PMB.  No pelvic pain. No pain with IC.  S/P TL.    3. Class 1 obesity due to excess calories with serious comorbidity and body mass index (BMI) of 32.0 to 32.9 in adult BMI improved to 32.68.  Currently on Mounjaro.  Increase fitness activities.   Low calorie/carb diet.  4. Need for immunization against influenza - Flu Vaccine QUAD 55mo+IM (Fluarix, Fluzone & Alfiuria Quad PF)  Other orders - Tirzepatide [facility] [place]); Inject into the skin.   Genia Del MD, 11:08 AM 09/18/2022

## 2022-09-19 ENCOUNTER — Ambulatory Visit: Payer: 59 | Admitting: Obstetrics & Gynecology

## 2022-09-19 LAB — CYTOLOGY - PAP: Diagnosis: NEGATIVE

## 2022-12-04 ENCOUNTER — Other Ambulatory Visit: Payer: Self-pay | Admitting: Obstetrics & Gynecology

## 2022-12-04 DIAGNOSIS — N760 Acute vaginitis: Secondary | ICD-10-CM

## 2022-12-06 NOTE — Telephone Encounter (Signed)
Patient requests refill for nystatin-triamcinolone cream.  Dr. Dellis Filbert is out of the office.

## 2023-02-21 ENCOUNTER — Ambulatory Visit: Payer: 59 | Admitting: Obstetrics & Gynecology

## 2023-02-21 ENCOUNTER — Encounter: Payer: Self-pay | Admitting: Obstetrics & Gynecology

## 2023-02-21 VITALS — BP 118/68 | HR 55

## 2023-02-21 DIAGNOSIS — B3731 Acute candidiasis of vulva and vagina: Secondary | ICD-10-CM

## 2023-02-21 DIAGNOSIS — L292 Pruritus vulvae: Secondary | ICD-10-CM

## 2023-02-21 DIAGNOSIS — N898 Other specified noninflammatory disorders of vagina: Secondary | ICD-10-CM

## 2023-02-21 DIAGNOSIS — N760 Acute vaginitis: Secondary | ICD-10-CM | POA: Diagnosis not present

## 2023-02-21 LAB — WET PREP FOR TRICH, YEAST, CLUE

## 2023-02-21 MED ORDER — FLUCONAZOLE 150 MG PO TABS: 150.0000 mg | ORAL_TABLET | ORAL | 0 refills | Status: AC

## 2023-02-21 MED ORDER — TINIDAZOLE 500 MG PO TABS: 1000.0000 mg | ORAL_TABLET | Freq: Two times a day (BID) | ORAL | 0 refills | Status: AC

## 2023-02-21 MED ORDER — NYSTATIN-TRIAMCINOLONE 100000-0.1 UNIT/GM-% EX OINT: 1.0000 | TOPICAL_OINTMENT | Freq: Two times a day (BID) | CUTANEOUS | 0 refills | Status: AC

## 2023-02-21 NOTE — Progress Notes (Signed)
    Pamela Shelton 1966/02/18 [id number]        57 y.o.  [id number] S/P BTL  RP: Vulvar itching and vaginal irritation  HPI: Vulvar itching and vaginal irritation.  Postmenopausal, well on no HRT.  No PMB.   OB History  Gravida Para Term Preterm AB Living  SAB IAB Ectopic Multiple Live Births  1 1          # Outcome Date GA Lbr Len/2nd Weight Sex Delivery Anes PTL Lv  6 IAB           5 SAB           4 Term           3 Term           2 Term           1 Term             Past medical history,surgical history, problem list, medications, allergies, family history and social history were all reviewed and documented in the EPIC chart.   Directed ROS with pertinent positives and negatives documented in the history of present illness/assessment and plan.  Exam:  Vitals:   02/21/23 1117  BP: 118/68  Pulse: (!) 55  SpO2: 98%   General appearance:  Normal  Gynecologic exam: Vulva with erythema bilaterally.  Speculum: Cervix/vagina normal.  Increased vaginal discharge.  Wet prep done.  Wet prep: Yeasts present                  Clue cells present with odor   Assessment/Plan:  57 y.o. [id number]   1. Vulvar itching Vulvar itching and vaginal irritation.  Postmenopausal, well on no HRT.  No PMB. Wet prep confirming both yeast infection and Bacterial Vaginosis.  Will treat with Tinidazole and Fluconazole.  Mycolog ointment on the vulva. Prescriptions sent to pharmacy. - WET PREP FOR TRICH, YEAST, CLUE  2. Vaginal discharge As above - WET PREP FOR TRICH, YEAST, CLUE  Other orders - tinidazole (TINDAMAX) 500 MG tablet; Take 2 tablets (1,000 mg total) by mouth 2 (two) times daily for 2 days. - fluconazole (DIFLUCAN) 150 MG tablet; Take 1 tablet (150 mg total) by mouth every other day for 3 doses. - nystatin-triamcinolone ointment (MYCOLOG); Apply 1 Application topically 2 (two) times daily for 14 days.   Genia Del MD, 11:20 AM 02/21/2023

## 2023-06-07 ENCOUNTER — Emergency Department (HOSPITAL_COMMUNITY)
Admission: EM | Admit: 2023-06-07 | Discharge: 2023-06-08 | Disposition: A | Discharge status: Home / Self Care | Payer: Managed Care, Other (non HMO) | Attending: Emergency Medicine | Admitting: Emergency Medicine

## 2023-06-07 ENCOUNTER — Encounter (HOSPITAL_COMMUNITY): Payer: Self-pay | Admitting: Emergency Medicine

## 2023-06-07 ENCOUNTER — Other Ambulatory Visit: Payer: Self-pay

## 2023-06-07 DIAGNOSIS — Z794 Long term (current) use of insulin: Secondary | ICD-10-CM | POA: Diagnosis not present

## 2023-06-07 DIAGNOSIS — Z7984 Long term (current) use of oral hypoglycemic drugs: Secondary | ICD-10-CM | POA: Insufficient documentation

## 2023-06-07 DIAGNOSIS — Z1152 Encounter for screening for COVID-19: Secondary | ICD-10-CM | POA: Insufficient documentation

## 2023-06-07 DIAGNOSIS — Z79899 Other long term (current) drug therapy: Secondary | ICD-10-CM | POA: Diagnosis not present

## 2023-06-07 DIAGNOSIS — E1165 Type 2 diabetes mellitus with hyperglycemia: Secondary | ICD-10-CM | POA: Insufficient documentation

## 2023-06-07 DIAGNOSIS — R109 Unspecified abdominal pain: Secondary | ICD-10-CM | POA: Insufficient documentation

## 2023-06-07 DIAGNOSIS — I10 Essential (primary) hypertension: Secondary | ICD-10-CM | POA: Diagnosis not present

## 2023-06-07 DIAGNOSIS — R110 Nausea: Secondary | ICD-10-CM | POA: Diagnosis not present

## 2023-06-07 DIAGNOSIS — R5383 Other fatigue: Secondary | ICD-10-CM | POA: Diagnosis present

## 2023-06-07 LAB — I-STAT VENOUS BLOOD GAS, ED
Acid-Base Excess: 1 mmol/L (ref 0.0–2.0)
Bicarbonate: 25.2 mmol/L (ref 20.0–28.0)
Calcium, Ion: 1.16 mmol/L (ref 1.15–1.40)
HCT: 38 % (ref 36.0–46.0)
Hemoglobin: 12.9 g/dL (ref 12.0–15.0)
O2 Saturation: 78 %
Patient temperature: 37
Potassium: 3.6 mmol/L (ref 3.5–5.1)
Sodium: 133 mmol/L — ABNORMAL LOW (ref 135–145)
TCO2: 26 mmol/L (ref 22–32)
pCO2, Ven: 36.1 mmHg — ABNORMAL LOW (ref 44–60)
pH, Ven: 7.452 — ABNORMAL HIGH (ref 7.25–7.43)
pO2, Ven: 40 mmHg (ref 32–45)

## 2023-06-07 LAB — BASIC METABOLIC PANEL
Anion gap: 15 (ref 5–15)
BUN: 10 mg/dL (ref 6–20)
CO2: 24 mmol/L (ref 22–32)
Calcium: 9.4 mg/dL (ref 8.9–10.3)
Chloride: 93 mmol/L — ABNORMAL LOW (ref 98–111)
Creatinine, Ser: 0.86 mg/dL (ref 0.44–1.00)
GFR, Estimated: >60 mL/min (ref >60)
Glucose, Bld: 505 mg/dL (ref 70–99)
Potassium: 3.6 mmol/L (ref 3.5–5.1)
Sodium: 132 mmol/L — ABNORMAL LOW (ref 135–145)

## 2023-06-07 LAB — CBC
HCT: 38.1 % (ref 36.0–46.0)
Hemoglobin: 12.6 g/dL (ref 12.0–15.0)
MCH: 26.9 pg (ref 26.0–34.0)
MCHC: 33.1 g/dL (ref 30.0–36.0)
MCV: 81.4 fL (ref 80.0–100.0)
Platelets: 280 10*3/uL (ref 150–400)
RBC: 4.68 MIL/uL (ref 3.87–5.11)
RDW: 13.5 % (ref 11.5–15.5)
WBC: 5.3 10*3/uL (ref 4.0–10.5)
nRBC: 0 % (ref 0.0–0.2)

## 2023-06-07 LAB — URINALYSIS, ROUTINE W REFLEX MICROSCOPIC
Bacteria, UA: NONE SEEN
Bilirubin Urine: NEGATIVE
Glucose, UA: >=500 mg/dL — AB
Ketones, ur: 20 mg/dL — AB
Leukocytes,Ua: NEGATIVE
Nitrite: NEGATIVE
Protein, ur: NEGATIVE mg/dL
Specific Gravity, Urine: 1.028 (ref 1.005–1.030)
pH: 5 (ref 5.0–8.0)

## 2023-06-07 LAB — CBG MONITORING, ED: Glucose-Capillary: 368 mg/dL — ABNORMAL HIGH (ref 70–99)

## 2023-06-07 LAB — SARS CORONAVIRUS 2 BY RT PCR: SARS Coronavirus 2 by RT PCR: NEGATIVE

## 2023-06-07 LAB — BETA-HYDROXYBUTYRIC ACID: Beta-Hydroxybutyric Acid: 1.78 mmol/L — ABNORMAL HIGH (ref 0.05–0.27)

## 2023-06-07 MED ORDER — INSULIN REGULAR(HUMAN) IN NACL 100-0.9 UT/100ML-% IV SOLN
INTRAVENOUS | Status: DC
  Filled 2023-06-07: qty 100

## 2023-06-07 MED ORDER — DEXTROSE IN LACTATED RINGERS 5 % IV SOLN: INTRAVENOUS | Status: DC

## 2023-06-07 MED ORDER — LACTATED RINGERS IV BOLUS
1000.0000 mL | Freq: Once | INTRAVENOUS | Status: AC
  Administered 2023-06-07: 1000 mL via INTRAVENOUS

## 2023-06-07 MED ORDER — LACTATED RINGERS IV SOLN: INTRAVENOUS | Status: DC

## 2023-06-07 MED ORDER — DEXTROSE 50 % IV SOLN: 0.0000 mL | INTRAVENOUS | Status: DC | PRN

## 2023-06-07 NOTE — ED Triage Notes (Signed)
Pt presents with generally feeling unwell this week. Had a change in her medications at the beginning of the week to Rybelsus.  States she had aches all over and no energy, constipation and then diarrhea.  Pt reports she just "don't feel well"

## 2023-06-08 LAB — CBG MONITORING, ED
Glucose-Capillary: 244 mg/dL — ABNORMAL HIGH (ref 70–99)
Glucose-Capillary: 282 mg/dL — ABNORMAL HIGH (ref 70–99)
Glucose-Capillary: 340 mg/dL — ABNORMAL HIGH (ref 70–99)
Glucose-Capillary: 364 mg/dL — ABNORMAL HIGH (ref 70–99)

## 2023-06-08 LAB — BETA-HYDROXYBUTYRIC ACID: Beta-Hydroxybutyric Acid: 1.54 mmol/L — ABNORMAL HIGH (ref 0.05–0.27)

## 2023-06-08 MED ORDER — INSULIN ASPART 100 UNIT/ML IJ SOLN
4.0000 [IU] | Freq: Once | INTRAMUSCULAR | Status: AC
  Administered 2023-06-08: 4 [IU] via SUBCUTANEOUS

## 2023-06-08 MED ORDER — ACETAMINOPHEN 500 MG PO TABS
1000.0000 mg | ORAL_TABLET | Freq: Once | ORAL | Status: AC
  Administered 2023-06-08: 1000 mg via ORAL
  Filled 2023-06-08: qty 2

## 2023-06-08 MED ORDER — LACTATED RINGERS IV BOLUS
1000.0000 mL | Freq: Once | INTRAVENOUS | Status: AC
  Administered 2023-06-08: 1000 mL via INTRAVENOUS

## 2023-06-08 NOTE — Discharge Instructions (Addendum)
You were evaluated today for hyperglycemia.  Your lab values trended in the right direction during your visit after getting a dose of insulin and IV fluids.  Please continue to take your diabetic medications as and follow-up with your primary care provider.  If you develop any life-threatening symptoms such as severe nausea, vomiting, changes in mental status, please return immediately to the emergency department.

## 2023-06-08 NOTE — ED Provider Notes (Signed)
[place] EMERGENCY DEPARTMENT AT [facility] Provider Note   CSN: [id number] Arrival date & time: 06/07/23  2038     History  Chief Complaint  Patient presents with   Fatigue    Pamela Shelton is a 57 y.o. female.  Patient presents to the emergency department complaining of feeling unwell for the past few days.  She states at the beginning the week she began Rybelsus for her diabetes.  She states that prior to that she had been out of her metformin and Jardiance for a few weeks.  She also reports being out of her atorvastatin for a few weeks.  The patient states approximate 3 days ago she had bodyaches and chills, felt a lack of energy, had a brief episode of constipation followed by diarrhea.  She continues to complain of diarrhea and feeling fatigued.  She states she has been thirsty than usual and has been urinating frequently.  She complains of some mild left-sided abdominal pain and nausea.  Patient denies chest pain, shortness of breath, fever, vomiting.  She does report frequent urinary tract infections.  Past medical history significant for hypertension, type II DM  HPI     Home Medications Prior to Admission medications   Medication Sig Start Date End Date Taking? Authorizing Provider  atorvastatin (LIPITOR) 20 MG tablet Take 20 mg by mouth daily. 06/09/22   [provider]  glucose blood (ACCU-CHEK AVIVA PLUS) test strip  12/18/11   [provider]  glucose blood test strip  12/18/11   [provider]  JARDIANCE 25 MG TABS tablet Take 25 mg by mouth daily. 02/06/22   [provider]  metFORMIN (GLUCOPHAGE) 1000 MG tablet Take 1,000 mg by mouth 2 (two) times daily with a meal.    [provider]  telmisartan-hydrochlorothiazide (MICARDIS HCT) 40-12.5 MG tablet Take 1 tablet by mouth daily. 12/29/21   Geoffery Lyons, MD      Allergies    Other and Pedi-pre tape spray [wound dressing adhesive]    Review of Systems   Review of  Systems  Physical Exam Updated Vital Signs BP 126/75   Pulse 74   Temp 99.8 F (37.7 C) (Oral)   Resp 20   LMP 08/11/2017 Comment: BTL, sexually active  SpO2 98%  Physical Exam Vitals and nursing note reviewed.  Constitutional:      General: She is not in acute distress.    Appearance: She is well-developed.  HENT:     Head: Normocephalic and atraumatic.     Mouth/Throat:     Mouth: Mucous membranes are moist.  Eyes:     Conjunctiva/sclera: Conjunctivae normal.  Cardiovascular:     Rate and Rhythm: Normal rate and regular rhythm.     Heart sounds: No murmur heard. Pulmonary:     Effort: Pulmonary effort is normal. No respiratory distress.     Breath sounds: Normal breath sounds.  Abdominal:     Palpations: Abdomen is soft.     Tenderness: There is no abdominal tenderness.  Musculoskeletal:        General: No swelling.     Cervical back: Neck supple.  Skin:    General: Skin is warm and dry.     Capillary Refill: Capillary refill takes less than 2 seconds.  Neurological:     Mental Status: She is alert.  Psychiatric:        Mood and Affect: Mood normal.     ED Results / Procedures / Treatments  Labs (all labs ordered are listed, but only abnormal results are displayed) Labs Reviewed  BASIC METABOLIC PANEL - Abnormal; Notable for the following components:      Result Value   Sodium 132 (*)    Chloride 93 (*)    Glucose, Bld 505 (*)    All other components within normal limits  URINALYSIS, ROUTINE W REFLEX MICROSCOPIC - Abnormal; Notable for the following components:   Color, Urine STRAW (*)    Glucose, UA >=500 (*)    Hgb urine dipstick SMALL (*)    Ketones, ur 20 (*)    All other components within normal limits  BETA-HYDROXYBUTYRIC ACID - Abnormal; Notable for the following components:   Beta-Hydroxybutyric Acid 1.78 (*)    All other components within normal limits  BETA-HYDROXYBUTYRIC ACID - Abnormal; Notable for the following components:    Beta-Hydroxybutyric Acid 1.54 (*)    All other components within normal limits  CBG MONITORING, ED - Abnormal; Notable for the following components:   Glucose-Capillary 368 (*)    All other components within normal limits  I-STAT VENOUS BLOOD GAS, ED - Abnormal; Notable for the following components:   pH, Ven 7.452 (*)    pCO2, Ven 36.1 (*)    Sodium 133 (*)    All other components within normal limits  CBG MONITORING, ED - Abnormal; Notable for the following components:   Glucose-Capillary 340 (*)    All other components within normal limits  CBG MONITORING, ED - Abnormal; Notable for the following components:   Glucose-Capillary 364 (*)    All other components within normal limits  CBG MONITORING, ED - Abnormal; Notable for the following components:   Glucose-Capillary 282 (*)    All other components within normal limits  CBG MONITORING, ED - Abnormal; Notable for the following components:   Glucose-Capillary 244 (*)    All other components within normal limits  SARS CORONAVIRUS 2 BY RT PCR  CBC    EKG EKG Interpretation Date/Time:  Friday June 07 2023 22:54:07 EDT Ventricular Rate:  85 PR Interval:  169 QRS Duration:  84 QT Interval:  367 QTC Calculation: 437 R Axis:   -23  Text Interpretation: Sinus rhythm Borderline left axis deviation Anterior infarct, age indeterminate Confirmed by Vonita Moss [phone]) on 06/07/2023 11:27:39 PM  Radiology No results found.  Procedures Procedures    Medications Ordered in ED Medications  lactated ringers infusion ( Intravenous New Bag/Given 06/08/23 0106)  lactated ringers bolus 1,000 mL (0 mLs Intravenous Stopped 06/08/23 0057)  insulin aspart (novoLOG) injection 4 Units (4 Units Subcutaneous Given 06/08/23 0100)  lactated ringers bolus 1,000 mL (0 mLs Intravenous Stopped 06/08/23 0221)  acetaminophen (TYLENOL) tablet 1,000 mg (1,000 mg Oral Given 06/08/23 0316)    ED Course/ Medical Decision Making/ A&P                                  Medical Decision Making Amount and/or Complexity of Data Reviewed Labs: ordered.  Risk OTC drugs. Prescription drug management.   This patient presents to the ED for concern of fatigue, this involves an extensive number of treatment options, and is a complaint that carries with it a high risk of complications and morbidity.  The differential diagnosis includes metabolic abnormality, viral infection, bacterial infection, others   Co morbidities that complicate the patient evaluation  Type II DM, hypertension   Additional history obtained:  Additional history obtained  from family at bedside   Lab Tests:  I Ordered, and personally interpreted labs.  The pertinent results include: Initial glucose 545, no anion gap, beta hydroxybutyric acid 1.78.  UA with glucose, ketones.  CBG 244 and beta hydroxybutyric acid 1.54 after fluids and insulin administration    Cardiac Monitoring: / EKG:  The patient was maintained on a cardiac monitor.  I personally viewed and interpreted the cardiac monitored which showed an underlying rhythm of: sinus rhythm   Problem List / ED Course / Critical interventions / Medication management   I ordered medication including Tylenol for headache, insulin for hyperglycemia, lactated Ringer's for hyperglycemia Reevaluation of the patient after these medicines showed that the patient improved I have reviewed the patients home medicines and have made adjustments as needed   Test / Admission - Considered:  Patient with improving lab values after IV fluids and insulin administration.  Beta hydroxybutyric acid and glucose trending in the right direction.  Patient with no anion gap, no acidosis.  At this time patient does not meet admission criteria.  Discussed importance of continued medication compliance at home and follow-up with primary care with patient.  Patient voices understanding.  Plan to discharge home at this time with return  precautions.         Final Clinical Impression(s) / ED Diagnoses Final diagnoses:  Hyperglycemia due to diabetes mellitus [facility])    Rx / DC Orders ED Discharge Orders     None         Pamala Duffel 06/08/23 0356    Palumbo, April, MD 06/08/23 1191

## 2023-06-17 ENCOUNTER — Telehealth: Payer: Self-pay | Admitting: *Deleted

## 2023-06-17 NOTE — Telephone Encounter (Signed)
Call returned to patient. Reports external vaginal itching and irritation for 2 weeks. Had Rx for diflucan previously prescribed by Dr. Seymour Bars, has completed 3 doses and used Mycology cream, no change in symptoms. Denies any other GYN symptoms. OV recommended. Scheduled for 8/16 at 12pm with Jami.   Last AEX 09/18/22 -ML  Routing to provider for final review. Patient is agreeable to disposition. Will close encounter.

## 2023-06-21 ENCOUNTER — Ambulatory Visit: Payer: Managed Care, Other (non HMO) | Admitting: Radiology

## 2023-06-21 ENCOUNTER — Encounter: Payer: Self-pay | Admitting: Radiology

## 2023-06-21 VITALS — BP 104/68

## 2023-06-21 DIAGNOSIS — R300 Dysuria: Secondary | ICD-10-CM | POA: Diagnosis not present

## 2023-06-21 DIAGNOSIS — N760 Acute vaginitis: Secondary | ICD-10-CM

## 2023-06-21 LAB — WET PREP FOR TRICH, YEAST, CLUE

## 2023-06-21 MED ORDER — NYSTATIN-TRIAMCINOLONE 100000-0.1 UNIT/GM-% EX OINT
1.0000 | TOPICAL_OINTMENT | Freq: Two times a day (BID) | CUTANEOUS | 0 refills | Status: DC
Start: 2023-06-21 — End: 2023-10-23

## 2023-06-21 MED ORDER — FLUCONAZOLE 150 MG PO TABS: 150.0000 mg | ORAL_TABLET | ORAL | 0 refills | Status: DC

## 2023-06-21 NOTE — Progress Notes (Signed)
      Subjective: Pamela Shelton is a 57 y.o. female who complains of vaginal itching, irritation, burning, dysuria, frequency, urgency, no discharge, no odor. Symptoms x's 1 month. Poor glucose control on current medications.   Review of Systems  All other systems reviewed and are negative.   Past Medical History:  Diagnosis Date   Diabetes mellitus without complication (HCC)    Hypertension       Objective:  Today's Vitals   06/21/23 1155  BP: 104/68   There is no height or weight on file to calculate BMI.   -General: no acute distress -Vulva: without lesions or discharge +erythema -Vagina: discharge present, wet prep obtained -Cervix: no lesion or discharge, no CMT -Perineum: no lesions -Uterus: Mobile, non tender -Adnexa: no masses or tenderness   Microscopic wet-mount exam shows hyphae.   Raynelle Fanning, CMA present for exam  Assessment:/Plan:   1. Dysuria Reassured normal u/s, will send C+S - Urinalysis,Complete w/RFL Culture  2. Vulvovaginitis - WET PREP FOR TRICH, YEAST, CLUE - nystatin-triamcinolone ointment (MYCOLOG); Apply 1 Application topically 2 (two) times daily.  Dispense: 30 g; Refill: 0 - fluconazole (DIFLUCAN) 150 MG tablet; Take 1 tablet (150 mg total) by mouth every 3 (three) days.  Dispense: 3 tablet; Refill: 0    Will contact patient with results of testing completed today. Avoid intercourse until symptoms are resolved. Safe sex encouraged. Avoid the use of soaps or perfumed products in the peri area. Avoid tub baths and sitting in sweaty or wet clothing for prolonged periods of time.

## 2023-06-24 LAB — URINALYSIS, COMPLETE W/RFL CULTURE
Bilirubin Urine: NEGATIVE
Hgb urine dipstick: NEGATIVE
Hyaline Cast: NONE SEEN /LPF
Ketones, ur: NEGATIVE
Leukocyte Esterase: NEGATIVE
Nitrites, Initial: NEGATIVE
Protein, ur: NEGATIVE
RBC / HPF: NONE SEEN /HPF (ref 0–2)
Specific Gravity, Urine: 1.01 (ref 1.001–1.035)
pH: 5.5 (ref 5.0–8.0)

## 2023-06-24 LAB — URINE CULTURE
MICRO NUMBER:: 15341748
SPECIMEN QUALITY:: ADEQUATE

## 2023-06-24 LAB — CULTURE INDICATED

## 2023-06-25 ENCOUNTER — Other Ambulatory Visit: Payer: Self-pay | Admitting: *Deleted

## 2023-06-25 MED ORDER — NITROFURANTOIN MONOHYD MACRO 100 MG PO CAPS
100.0000 mg | ORAL_CAPSULE | Freq: Two times a day (BID) | ORAL | 0 refills | Status: DC
Start: 2023-06-25 — End: 2023-10-23

## 2023-09-20 ENCOUNTER — Ambulatory Visit: Payer: Managed Care, Other (non HMO) | Admitting: Obstetrics and Gynecology

## 2023-09-20 ENCOUNTER — Ambulatory Visit: Payer: Managed Care, Other (non HMO) | Admitting: Obstetrics & Gynecology

## 2023-10-22 ENCOUNTER — Telehealth: Payer: Self-pay

## 2023-10-22 NOTE — Telephone Encounter (Signed)
Yes, needs OV

## 2023-10-22 NOTE — Telephone Encounter (Signed)
Pt LVM in triage line stating that she feels that she has a "vaginal infection" and is very uncomfortable in the vaginal area. Requesting refills on previous vaginitis medications sent for her.

## 2023-10-22 NOTE — Telephone Encounter (Signed)
Notified the pt, she voiced understanding/appreciation for the call.   Inquired during the time of the call, if any form of vaginitis can cause a fever? Pt reports not taking her temp at home but just has a feeling at times that she may have one. Denies any other sxs other than vulvar/vaginal, denies pain or signs of bladder infection.  At the time of call, pt advised to check her temp and if elevated, advised her to check a COVID test prior to appt tomorrow scheduled for 12/18 @ 945 (arrival at 930).  Pt voiced understanding and appreciation for call/information.   Routing to provider for final review and to provide any additional advice if needed.

## 2023-10-23 ENCOUNTER — Ambulatory Visit (INDEPENDENT_AMBULATORY_CARE_PROVIDER_SITE_OTHER): Payer: Managed Care, Other (non HMO) | Admitting: Nurse Practitioner

## 2023-10-23 VITALS — BP 112/62 | HR 63 | Wt 195.0 lb

## 2023-10-23 DIAGNOSIS — N760 Acute vaginitis: Secondary | ICD-10-CM | POA: Diagnosis not present

## 2023-10-23 DIAGNOSIS — B3731 Acute candidiasis of vulva and vagina: Secondary | ICD-10-CM

## 2023-10-23 LAB — WET PREP FOR TRICH, YEAST, CLUE

## 2023-10-23 MED ORDER — FLUCONAZOLE 150 MG PO TABS
150.0000 mg | ORAL_TABLET | ORAL | 0 refills | Status: AC | PRN
Start: 2023-10-23 — End: ?

## 2023-10-23 NOTE — Progress Notes (Signed)
   Acute Office Visit  Subjective:    Patient ID: Pamela Shelton, female    DOB: July 18, 1966, 57 y.o.   MRN: [id number]   HPI 57 y.o. presents today for vulvovaginal itching x 2 weeks. Complains of recurrent yeast infections. On Jardiance. + yeast 02/2023, 06/2023. + BV 06/2022, 02/2023. Used previously prescribed yeast cream for a few days.   Patient's last menstrual period was 08/11/2017.    Review of Systems  Constitutional: Negative.   Genitourinary:  Positive for vaginal pain (Vulvar itching). Negative for vaginal discharge.       Objective:    Physical Exam Constitutional:      Appearance: Normal appearance.  Genitourinary:    Vagina: Normal.     Cervix: Normal.     Comments: Redness and excoriation of labia    BP 112/62 (BP Location: Left Arm, Patient Position: Sitting, Cuff Size: Large)   Pulse 63   Wt 195 lb (88.5 kg)   LMP 08/11/2017 Comment: BTL, sexually active  SpO2 100%   BMI 34.82 kg/m  Wt Readings from Last 3 Encounters:  10/23/23 195 lb (88.5 kg)  09/18/22 183 lb (83 kg)  12/29/21 205 lb (93 kg)        Patient informed chaperone available to be present for breast and/or pelvic exam. Patient has requested no chaperone to be present. Patient has been advised what will be completed during breast and pelvic exam.   Wet prep + yeast (budding noted)  Assessment & Plan:   Problem List Items Addressed This Visit   None Visit Diagnoses       Vulvovaginal candidiasis    -  Primary   Relevant Medications   fluconazole (DIFLUCAN) 150 MG tablet   Other Relevant Orders   WET PREP FOR TRICH, YEAST, CLUE     Recurrent vaginitis       Relevant Medications   fluconazole (DIFLUCAN) 150 MG tablet      Plan: Wet prep positive for yeast - Diflucan 150 mg today and repeat in 3 days. Extra Diflucan provided to use as needed. Plans to discuss alternative to Jardiance with PCP.   Return if symptoms worsen or fail to improve.    Olivia Mackie DNP, 10:05 AM  10/23/2023
